# Patient Record
Sex: Female | Born: 1995 | Race: White | Hispanic: No | Marital: Single | Smoking: Never smoker
Health system: Southern US, Community
[De-identification: ages and names within clinical notes are randomized; demographics above are authoritative.]

---

## 2015-07-11 ENCOUNTER — Emergency Department: Payer: BLUE CROSS/BLUE SHIELD

## 2015-07-11 ENCOUNTER — Encounter: Payer: Self-pay | Admitting: *Deleted

## 2015-07-11 ENCOUNTER — Emergency Department
Admission: EM | Admit: 2015-07-11 | Discharge: 2015-07-12 | Disposition: A | Discharge status: Home / Self Care | Payer: BLUE CROSS/BLUE SHIELD | Attending: Emergency Medicine | Admitting: Emergency Medicine

## 2015-07-11 DIAGNOSIS — R519 Headache, unspecified: Secondary | ICD-10-CM

## 2015-07-11 DIAGNOSIS — B349 Viral infection, unspecified: Secondary | ICD-10-CM | POA: Insufficient documentation

## 2015-07-11 DIAGNOSIS — Z3202 Encounter for pregnancy test, result negative: Secondary | ICD-10-CM | POA: Insufficient documentation

## 2015-07-11 DIAGNOSIS — R509 Fever, unspecified: Secondary | ICD-10-CM

## 2015-07-11 DIAGNOSIS — R000 Tachycardia, unspecified: Secondary | ICD-10-CM | POA: Insufficient documentation

## 2015-07-11 DIAGNOSIS — R51 Headache: Secondary | ICD-10-CM

## 2015-07-11 LAB — URINALYSIS COMPLETE WITH MICROSCOPIC (ARMC ONLY)
BILIRUBIN URINE: NEGATIVE
GLUCOSE, UA: NEGATIVE mg/dL
Hgb urine dipstick: NEGATIVE
KETONES UR: NEGATIVE mg/dL
NITRITE: NEGATIVE
Protein, ur: NEGATIVE mg/dL
SPECIFIC GRAVITY, URINE: 1.018 (ref 1.005–1.030)
pH: 6 (ref 5.0–8.0)

## 2015-07-11 LAB — CBC WITH DIFFERENTIAL/PLATELET
BASOS ABS: 0 10*3/uL (ref 0–0.1)
Basophils Relative: 0 %
EOS ABS: 0 10*3/uL (ref 0–0.7)
Eosinophils Relative: 0 %
HEMATOCRIT: 40.1 % (ref 35.0–47.0)
HEMOGLOBIN: 13.3 g/dL (ref 12.0–16.0)
Lymphocytes Relative: 13 %
Lymphs Abs: 1 10*3/uL (ref 1.0–3.6)
MCH: 30 pg (ref 26.0–34.0)
MCHC: 33.1 g/dL (ref 32.0–36.0)
MCV: 90.7 fL (ref 80.0–100.0)
MONOS PCT: 10 %
Monocytes Absolute: 0.8 10*3/uL (ref 0.2–0.9)
NEUTROS ABS: 6.1 10*3/uL (ref 1.4–6.5)
NEUTROS PCT: 77 %
Platelets: 181 10*3/uL (ref 150–440)
RBC: 4.42 MIL/uL (ref 3.80–5.20)
RDW: 14.7 % — ABNORMAL HIGH (ref 11.5–14.5)
WBC: 8 10*3/uL (ref 3.6–11.0)

## 2015-07-11 LAB — PROTIME-INR
INR: 1.1
Prothrombin Time: 14.4 seconds (ref 11.4–15.0)

## 2015-07-11 LAB — COMPREHENSIVE METABOLIC PANEL
ALBUMIN: 3.8 g/dL (ref 3.5–5.0)
ALK PHOS: 41 U/L (ref 38–126)
ALT: 14 U/L (ref 14–54)
ANION GAP: 7 (ref 5–15)
AST: 25 U/L (ref 15–41)
BUN: 6 mg/dL (ref 6–20)
CALCIUM: 8.6 mg/dL — AB (ref 8.9–10.3)
CO2: 25 mmol/L (ref 22–32)
Chloride: 104 mmol/L (ref 101–111)
Creatinine, Ser: 0.77 mg/dL (ref 0.44–1.00)
GFR calc Af Amer: >60 mL/min (ref >60)
GFR calc non Af Amer: >60 mL/min (ref >60)
Glucose, Bld: 138 mg/dL — ABNORMAL HIGH (ref 65–99)
Potassium: 3.8 mmol/L (ref 3.5–5.1)
SODIUM: 136 mmol/L (ref 135–145)
TOTAL PROTEIN: 7.6 g/dL (ref 6.5–8.1)
Total Bilirubin: 0.6 mg/dL (ref 0.3–1.2)

## 2015-07-11 LAB — PROTEIN, CSF: TOTAL PROTEIN, CSF: 15 mg/dL (ref 15–45)

## 2015-07-11 LAB — POCT PREGNANCY, URINE: PREG TEST UR: NEGATIVE

## 2015-07-11 LAB — GLUCOSE, CAPILLARY: GLUCOSE-CAPILLARY: 80 mg/dL (ref 65–99)

## 2015-07-11 LAB — MONONUCLEOSIS SCREEN: Mono Screen: NEGATIVE

## 2015-07-11 LAB — LACTIC ACID, PLASMA: LACTIC ACID, VENOUS: 0.9 mmol/L (ref 0.5–2.0)

## 2015-07-11 LAB — GLUCOSE, CSF: GLUCOSE CSF: 74 mg/dL — AB (ref 40–70)

## 2015-07-11 MED ORDER — SODIUM CHLORIDE 0.9 % IV BOLUS (SEPSIS)
1000.0000 mL | Freq: Once | INTRAVENOUS | Status: AC
  Administered 2015-07-11: 1000 mL via INTRAVENOUS

## 2015-07-11 MED ORDER — LIDOCAINE HCL 2 % IJ SOLN
20.0000 mL | Freq: Once | INTRAMUSCULAR | Status: DC
  Filled 2015-07-11: qty 20

## 2015-07-11 MED ORDER — LIDOCAINE HCL (PF) 1 % IJ SOLN
5.0000 mL | Freq: Once | INTRAMUSCULAR | Status: AC
  Administered 2015-07-11: 5 mL via INTRADERMAL

## 2015-07-11 MED ORDER — MORPHINE SULFATE (PF) 2 MG/ML IV SOLN
2.0000 mg | Freq: Once | INTRAVENOUS | Status: AC
  Administered 2015-07-11: 2 mg via INTRAVENOUS
  Filled 2015-07-11: qty 1

## 2015-07-11 MED ORDER — KETOROLAC TROMETHAMINE 30 MG/ML IJ SOLN
30.0000 mg | Freq: Once | INTRAMUSCULAR | Status: AC
  Administered 2015-07-11: 30 mg via INTRAVENOUS
  Filled 2015-07-11: qty 1

## 2015-07-11 MED ORDER — ACETAMINOPHEN 325 MG PO TABS: 650.0000 mg | ORAL_TABLET | Freq: Once | ORAL | Status: DC | PRN

## 2015-07-11 MED ORDER — IBUPROFEN 800 MG PO TABS: 800.0000 mg | ORAL_TABLET | Freq: Once | ORAL | Status: DC

## 2015-07-11 MED ORDER — LIDOCAINE HCL (PF) 1 % IJ SOLN
INTRAMUSCULAR | Status: AC
Start: 2015-07-11 — End: 2015-07-11
  Administered 2015-07-11: 23:00:00
  Filled 2015-07-11: qty 15

## 2015-07-11 MED ORDER — ONDANSETRON 4 MG PO TBDP: 4.0000 mg | ORAL_TABLET | Freq: Four times a day (QID) | ORAL | Status: DC | PRN

## 2015-07-11 NOTE — ED Notes (Signed)
Went to [facility] yesterday for same. Was negative for strep and flu. Sent back to school. Still with fever today, headache. Bodyaches. States head hurts worse when she lays down.

## 2015-07-11 NOTE — ED Notes (Signed)
Pt reports 2 day hx of fever >103, HA and generalized body aches.  Increased pain while laying down.  Pt is a [employer] student and [facility] [employer].  [facility] and [facility] negative for the flu and strep.  Denies N/V/D.

## 2015-07-11 NOTE — ED Provider Notes (Signed)
[facility] Emergency Department Provider Note REMINDER - THIS NOTE IS NOT A FINAL MEDICAL RECORD UNTIL IT IS SIGNED. UNTIL THEN, THE CONTENT BELOW MAY REFLECT INFORMATION FROM A DOCUMENTATION TEMPLATE, NOT THE ACTUAL PATIENT VISIT. ____________________________________________  Time seen: Approximately 9:45 PM  I have reviewed the triage vital signs and the nursing notes.   HISTORY  Chief Complaint Headache    HPI Kelsey Rollins is a 19 y.o. female no significant medical history. She presents today after 2 days of fever greater than 103, moderate to severe throbbing headachewith light sensitivity, but no neck pain or stiffness. She reports generalized body aches, slightly weak cough, nonproductive. She reports 2 roommates of an ill recently. She denies any urinary symptoms, she did have some vaginal discharge and a few weeks ago but was evaluated in [place] for this and was told that all samples are normal. She denies any pelvic pain today. She does use tampons but only last use a couple weeks ago and has not lost any.  She denies any tick bites, insect bites, rash. No abdominal pain nausea or vomiting. No pelvic pain. No numbness or weakness.  She was seen at urgent care yesterday and was told she had a negative strep and also had a flu test which was negative.  She presents today she is not feeling better and continues to have severe headache with associated fever.   History reviewed. No pertinent past medical history.  There are no active problems to display for this patient.   History reviewed. No pertinent past surgical history.  Current Outpatient Rx  Name  Route  Sig  Dispense  Refill  . ondansetron (ZOFRAN ODT) 4 MG disintegrating tablet   Oral   Take 1 tablet (4 mg total) by mouth every 6 (six) hours as needed for nausea or vomiting.   20 tablet   0     Allergies Review of patient's allergies indicates no known allergies.  History  reviewed. No pertinent family history.  Social History Social History  Substance Use Topics  . Smoking status: Never Smoker   . Smokeless tobacco: None  . Alcohol Use: No    Review of Systems Constitutional: Fevers chills and body aches  Eyes: No visual changes. ENT: No sore throat. Cardiovascular: Denies chest pain. Respiratory: Denies shortness of breath. Does have occasional dry cough. Gastrointestinal: No abdominal pain.  Some nausea but, no vomiting.  No diarrhea.  No constipation. Genitourinary: Negative for dysuria. Musculoskeletal: Negative for back pain. Skin: Negative for rash. Neurological: Negative for focal weakness or numbness.  No travel. She has been previously immunized, but did not receive meningitis vaccination.  10-point ROS otherwise negative.  ____________________________________________   PHYSICAL EXAM:  VITAL SIGNS: ED Triage Vitals  Enc Vitals Group     BP 07/11/15 1955 100/68 mmHg     Pulse Rate 07/11/15 1955 126     Resp 07/11/15 1955 20     Temp 07/11/15 1955 103.2 F (39.6 C)     Temp Source 07/11/15 1955 Oral     SpO2 07/11/15 1955 99 %     Weight 07/11/15 1955 130 lb (58.968 kg)     Height 07/11/15 1955  (1.651 m)     Head Cir --      Peak Flow --      Pain Score 07/11/15 2000 10     Pain Loc --      Pain Edu? --      Excl. in GC? --  Constitutional: Alert and oriented. Generally fatigued and somewhat ill-appearing appearing and in no acute distress. Amicable, escorted by her mother. Both are very kind.  Eyes: Conjunctivae are normal. PERRL. EOMI. Head: Atraumatic. Nose: No congestion/rhinnorhea. No tonsillar hypertrophy or exudates. Mouth/Throat: Mucous membranes are moist.  Oropharynx non-erythematous. Neck: No stridor.  No obvious meningismus. Turning head side to side does make her headache somewhat worse however. Cardiovascular: Tachycardic rate, regular rhythm. Grossly normal heart sounds.  Good peripheral  circulation. Respiratory: Normal respiratory effort.  No retractions. Lungs CTAB. Occasional dry cough. Gastrointestinal: Soft and nontender. No distention. No abdominal bruits. No CVA tenderness. Musculoskeletal: No lower extremity tenderness nor edema.  No joint effusions. Neurologic:  Normal speech and language. No gross focal neurologic deficits are appreciated. No gait instability. She does not have appreciable photophobia. Skin:  Skin is warm, dry and intact. No rash noted. Psychiatric: Mood and affect are normal. Speech and behavior are normal.  Patient reports she is not sexually active. ____________________________________________   LABS (all labs ordered are listed, but only abnormal results are displayed)  Labs Reviewed  CBC WITH DIFFERENTIAL/PLATELET - Abnormal; Notable for the following:    RDW 14.7 (*)    All other components within normal limits  COMPREHENSIVE METABOLIC PANEL - Abnormal; Notable for the following:    Glucose, Bld 138 (*)    Calcium 8.6 (*)    All other components within normal limits  URINALYSIS COMPLETEWITH MICROSCOPIC ([facility] ONLY) - Abnormal; Notable for the following:    Color, Urine YELLOW (*)    APPearance HAZY (*)    Leukocytes, UA TRACE (*)    Bacteria, UA RARE (*)    Squamous Epithelial / LPF 0-5 (*)    All other components within normal limits  CULTURE, GROUP A STREP ([facility] ONLY)  CSF CULTURE  LACTIC ACID, PLASMA  PROTIME-INR  MONONUCLEOSIS SCREEN  LACTIC ACID, PLASMA  CSF CELL COUNT WITH DIFFERENTIAL  GLUCOSE, CSF  PROTEIN, CSF  CSF CELL COUNT WITH DIFFERENTIAL  HERPES SIMPLEX VIRUS(HSV) DNA BY PCR  ROCKY MTN SPOTTED FVR ABS PNL(IGG+IGM)  POC URINE PREG, ED  POCT PREGNANCY, URINE   ____________________________________________  EKG   ____________________________________________  RADIOLOGY  DG Chest 2 View (Final result) Result time: 07/11/15 21:52:00   Final result by Rad Results In Interface (07/11/15 21:52:00)    Narrative:   CLINICAL DATA: Fever and generalized body aches for 2 days.  EXAM: CHEST 2 VIEW  COMPARISON: None.  FINDINGS: The heart size and mediastinal contours are within normal limits. Both lungs are clear. The visualized skeletal structures are unremarkable.  IMPRESSION: No active cardiopulmonary disease.    ____________________________________________   PROCEDURES  Procedure(s) performed: Number puncture  Critical Care performed: No  LUMBAR PUNCTURE  Date/Time: 07/11/2015 at 11:13 PM Performed by: Sharyn Creamer  Consent: Verbal consent obtained. Written consent obtained. Risks and benefits: risks, benefits and alternatives were discussed Consent given by: Patient Patient understanding: patient states understanding of the procedure being performed  Patient consent: the patient's understanding of the procedure matches consent given  Procedure consent: procedure consent matches procedure scheduled  Relevant documents: relevant documents present and verified  Test results: test results available and properly labeled Site marked: the operative site was marked Imaging studies: imaging studies available  Required items: required blood products, implants, devices, and special equipment available  Patient identity confirmed: verbally with patient and arm band  Time out: Immediately prior to procedure a "time out" was called to verify the correct patient, procedure, equipment,  support staff and site/side marked as required.  Indications: Severe headache and fever, rule out meningitis  Anesthesia: local infiltration Local anesthetic: lidocaine 1% without epinephrine Anesthetic total: 4 ml Patient sedated: No  Analgesia:  Preparation: Patient was prepped and draped in the usual sterile fashion. Lumbar space: L3-L4 interspace Patient's position: Seated upright  Needle gauge: 22 Needle length: 3.5 in Number of attempts: 1 Opening pressure: Not measured, performed in  seated position  Fluid appearance: Clear Tubes of fluid: 4 Total volume: 8 ml Post-procedure: site cleaned and adhesive bandage applied Patient tolerance: Patient tolerated the procedure well with no immediate complications, neurologically intact distally. Tolerated well without significant discomfort or pain.  ____________________________________________   INITIAL IMPRESSION / ASSESSMENT AND PLAN / ED COURSE  Pertinent labs & imaging results that were available during my care of the patient were reviewed by me and considered in my medical decision making (see chart for details).  Patient resents for evaluation of severe fever, headache. Also dry nonproductive cough. Seen in urgent care and felt to have viral syndrome yesterday, with a negative flu and strep test per mother. Today she presents to ER for ongoing severe headache and fever. On exam she has no focal bacterial infection, and she does not appear to have significant risk factor for any immunocompromise status. I am genuinely concerned about the severity of headache that she reports, however her overall symptoms of allergy does seem to be most likely viral in etiology, but given the extent of her fever and the severity of her headache I do believe that rule out meningitis is indicated if no other causes detected.  Wide battery of tests were performed, chest x-ray, urinalysis, lab work, mono all negative for obvious etiology for the patient's headache and fever. Based on this, spinal puncture was recommended and performed.  ----------------------------------------- 9:52 PM on 07/11/2015 -----------------------------------------  Discussed the risks, benefits, alternatives and potential, locations to lumbar puncture with the patient and her mother. Based on no clear other etiology for the patient's headache and high fevers, we will proceed with lumbar puncture as we rule out meningitis. I do believe this is warranted given the patient's  report of a moderate to severe headache, high-grade fevers. No other focal infection to clearly point, though viral illness certainly a strong possibility, however given her symptomatology exclusion of meningitis appears to be a vital at this time.  Consent discussed and signed by the patient.  ----------------------------------------- 11:16 PM on 07/11/2015 -----------------------------------------  Ongoing care and disposition assigned Dr. Dolores FrameSung. Plan of care is to follow-up on lumbar puncture results, if indication of infection would start antibiotics and admit for possible meningitis, however my general suspicion is that this is likely self-limited viral type illness. If her CSF studies appear normal, I would advise discharge with Zofran and careful return precautions for viral syndrome. I did discuss this plan of care with the patient as well, and I did advise and careful return precautions should she be discharged once her CSF studies are performed. I did not immediately initiate antibiotics after lumbar puncture is my general consensus is likely viral, but I certainly do wish to exclude acute bacterial meningitis though it is not my primary diagnosis at this point. ____________________________________________   FINAL CLINICAL IMPRESSION(S) / ED DIAGNOSES  Final diagnoses:  Fever and chills  Acute nonintractable headache, unspecified headache type      Sharyn CreamerMark Cardale Dorer, MD 07/11/15 2318

## 2015-07-11 NOTE — ED Notes (Signed)
MD at bedside for LP.

## 2015-07-12 LAB — CSF CELL COUNT WITH DIFFERENTIAL
EOS CSF: 0 % (ref 0–1)
EOS CSF: 0 % (ref 0–1)
LYMPHS CSF: 0 % — AB (ref 40–80)
LYMPHS CSF: 67 % (ref 40–80)
Monocyte-Macrophage-Spinal Fluid: 0 % — ABNORMAL LOW (ref 15–45)
Monocyte-Macrophage-Spinal Fluid: 33 % (ref 15–45)
RBC Count, CSF: 0 /mm3
RBC Count, CSF: 3 /mm3 — ABNORMAL HIGH
SEGMENTED NEUTROPHILS-CSF: 0 % (ref 0–6)
SEGMENTED NEUTROPHILS-CSF: 0 % (ref 0–6)
TUBE #: 1
Tube #: 4
WBC CSF: 0 /mm3 (ref 0–5)
WBC CSF: 3 /mm3 (ref 0–5)

## 2015-07-12 MED ORDER — ACETAMINOPHEN 500 MG PO TABS
1000.0000 mg | ORAL_TABLET | Freq: Once | ORAL | Status: AC
  Administered 2015-07-12: 1000 mg via ORAL
  Filled 2015-07-12: qty 2

## 2015-07-12 MED ORDER — PROCHLORPERAZINE MALEATE 10 MG PO TABS: 10.0000 mg | ORAL_TABLET | Freq: Four times a day (QID) | ORAL | Status: AC | PRN

## 2015-07-12 NOTE — ED Provider Notes (Signed)
-----------------------------------------   1:28 AM on 07/12/2015 -----------------------------------------  Updated patient and her mother of negative CSF results. Patient is feeling significantly better. She has no neurological deficits at this time. Mother requests a prescription for headache. I will write for Compazine. Encourage patient to push fluids this weekend and limit her activities. Strict return precautions given. Both verbalize understanding and agree with plan of care.  Irean HongJade J Najeh Credit, MD 07/12/15 [phone]

## 2015-07-12 NOTE — Discharge Instructions (Signed)
1. Alternate Tylenol and Motrin every 3-4 hours as needed for temperature greater than 100.67F. 2. You may take Compazine as needed for headache and nausea (#20). 3. Drink plenty of fluids this weekend and rest as much as possible. 4. Return to the ER for worsening symptoms, persistent vomiting, difficulty breathing or other concerns.  Fever, Adult A fever is an increase in the body's temperature. It is usually defined as a temperature of 100F (38C) or higher. Brief mild or moderate fevers generally have no long-term effects, and they often do not require treatment. Moderate or high fevers may make you feel uncomfortable and can sometimes be a sign of a serious illness or disease. The sweating that may occur with repeated or prolonged fever may also cause dehydration. Fever is confirmed by taking a temperature with a thermometer. A measured temperature can vary with:  Age.  Time of day.  Location of the thermometer:  Mouth (oral).  Rectum (rectal).  Ear (tympanic).  Underarm (axillary).  Forehead (temporal). HOME CARE INSTRUCTIONS Pay attention to any changes in your symptoms. Take these actions to help with your condition:  Take over-the counter and prescription medicines only as told by your health care provider. Follow the dosing instructions carefully.  If you were prescribed an antibiotic medicine, take it as told by your health care provider. Do not stop taking the antibiotic even if you start to feel better.  Rest as needed.  Drink enough fluid to keep your urine clear or pale yellow. This helps to prevent dehydration.  Sponge yourself or bathe with room-temperature water to help reduce your body temperature as needed. Do not use ice water.  Do not overbundle yourself in blankets or heavy clothes. SEEK MEDICAL CARE IF:  You vomit.  You cannot eat or drink without vomiting.  You have diarrhea.  You have pain when you urinate.  Your symptoms do not improve with  treatment.  You develop new symptoms.  You develop excessive weakness. SEEK IMMEDIATE MEDICAL CARE IF:  You have shortness of breath or have trouble breathing.  You are dizzy or you faint.  You are disoriented or confused.  You develop signs of dehydration, such as a dry mouth, decreased urination, or paleness.  You develop severe pain in your abdomen.  You have persistent vomiting or diarrhea.  You develop a skin rash.  Your symptoms suddenly get worse.   This information is not intended to replace advice given to you by your health care provider. Make sure you discuss any questions you have with your health care provider.   Document Released: 02/16/2001 Document Revised: 05/14/2015 Document Reviewed: 10/17/2014 Elsevier Interactive Patient Education 2016 [employer] Inc[employer]  General Headache Without Cause A headache is pain or discomfort felt around the head or neck area. The specific cause of a headache may not be found. There are many causes and types of headaches. A few common ones are:  Tension headaches.  Migraine headaches.  Cluster headaches.  Chronic daily headaches. HOME CARE INSTRUCTIONS  Watch your condition for any changes. Take these steps to help with your condition: Managing Pain  Take over-the-counter and prescription medicines only as told by your health care provider.  Lie down in a dark, quiet room when you have a headache.  If directed, apply ice to the head and neck area:  Put ice in a plastic bag.  Place a towel between your skin and the bag.  Leave the ice on for 20 minutes, 2-3 times per day.  Use a heating pad or hot shower to apply heat to the head and neck area as told by your health care provider.  Keep lights dim if bright lights bother you or make your headaches worse. Eating and Drinking  Eat meals on a regular schedule.  Limit alcohol use.  Decrease the amount of caffeine you drink, or stop drinking caffeine. General  Instructions  Keep all follow-up visits as told by your health care provider. This is important.  Keep a headache journal to help find out what may trigger your headaches. For example, write down:  What you eat and drink.  How much sleep you get.  Any change to your diet or medicines.  Try massage or other relaxation techniques.  Limit stress.  Sit up straight, and do not tense your muscles.  Do not use tobacco products, including cigarettes, chewing tobacco, or e-cigarettes. If you need help quitting, ask your health care provider.  Exercise regularly as told by your health care provider.  Sleep on a regular schedule. Get 7-9 hours of sleep, or the amount recommended by your health care provider. SEEK MEDICAL CARE IF:   Your symptoms are not helped by medicine.  You have a headache that is different from the usual headache.  You have nausea or you vomit.  You have a fever. SEEK IMMEDIATE MEDICAL CARE IF:   Your headache becomes severe.  You have repeated vomiting.  You have a stiff neck.  You have a loss of vision.  You have problems with speech.  You have pain in the eye or ear.  You have muscular weakness or loss of muscle control.  You lose your balance or have trouble walking.  You feel faint or pass out.  You have confusion.   This information is not intended to replace advice given to you by your health care provider. Make sure you discuss any questions you have with your health care provider.   Document Released: 08/23/2005 Document Revised: 05/14/2015 Document Reviewed: 12/16/2014 Elsevier Interactive Patient Education 2016 [employer] Inc.   Viral Infections A viral infection can be caused by different types of viruses.Most viral infections are not serious and resolve on their own. However, some infections may cause severe symptoms and may lead to further complications. SYMPTOMS Viruses can frequently cause:  Minor sore throat.  Aches and  pains.  Headaches.  Runny nose.  Different types of rashes.  Watery eyes.  Tiredness.  Cough.  Loss of appetite.  Gastrointestinal infections, resulting in nausea, vomiting, and diarrhea. These symptoms do not respond to antibiotics because the infection is not caused by bacteria. However, you might catch a bacterial infection following the viral infection. This is sometimes called a "superinfection." Symptoms of such a bacterial infection may include:  Worsening sore throat with pus and difficulty swallowing.  Swollen neck glands.  Chills and a high or persistent fever.  Severe headache.  Tenderness over the sinuses.  Persistent overall ill feeling (malaise), muscle aches, and tiredness (fatigue).  Persistent cough.  Yellow, green, or brown mucus production with coughing. HOME CARE INSTRUCTIONS   Only take over-the-counter or prescription medicines for pain, discomfort, diarrhea, or fever as directed by your caregiver.  Drink enough water and fluids to keep your urine clear or pale yellow. Sports drinks can provide valuable electrolytes, sugars, and hydration.  Get plenty of rest and maintain proper nutrition. Soups and broths with crackers or rice are fine. SEEK IMMEDIATE MEDICAL CARE IF:   You have severe headaches, shortness  of breath, chest pain, neck pain, or an unusual rash.  You have uncontrolled vomiting, diarrhea, or you are unable to keep down fluids.  You or your child has an oral temperature above 102 F (38.9 C), not controlled by medicine.  Your baby is older than 3 months with a rectal temperature of 102 F (38.9 C) or higher.  Your baby is 323 months old or younger with a rectal temperature of 100.4 F (38 C) or higher. MAKE SURE YOU:   Understand these instructions.  Will watch your condition.  Will get help right away if you are not doing well or get worse.   This information is not intended to replace advice given to you by your health  care provider. Make sure you discuss any questions you have with your health care provider.   Document Released: 06/02/2005 Document Revised: 11/15/2011 Document Reviewed: 01/29/2015 Elsevier Interactive Patient Education [employer] Inc[employer]

## 2015-07-13 ENCOUNTER — Emergency Department
Admission: EM | Admit: 2015-07-13 | Discharge: 2015-07-13 | Disposition: A | Discharge status: Home / Self Care | Payer: BLUE CROSS/BLUE SHIELD | Attending: Emergency Medicine | Admitting: Emergency Medicine

## 2015-07-13 ENCOUNTER — Encounter: Payer: Self-pay | Admitting: Emergency Medicine

## 2015-07-13 ENCOUNTER — Other Ambulatory Visit: Payer: Self-pay

## 2015-07-13 DIAGNOSIS — R55 Syncope and collapse: Secondary | ICD-10-CM | POA: Insufficient documentation

## 2015-07-13 DIAGNOSIS — Z3202 Encounter for pregnancy test, result negative: Secondary | ICD-10-CM | POA: Insufficient documentation

## 2015-07-13 DIAGNOSIS — E860 Dehydration: Secondary | ICD-10-CM

## 2015-07-13 DIAGNOSIS — R110 Nausea: Secondary | ICD-10-CM | POA: Insufficient documentation

## 2015-07-13 DIAGNOSIS — R509 Fever, unspecified: Secondary | ICD-10-CM | POA: Diagnosis present

## 2015-07-13 DIAGNOSIS — B349 Viral infection, unspecified: Secondary | ICD-10-CM | POA: Diagnosis not present

## 2015-07-13 LAB — URINALYSIS COMPLETE WITH MICROSCOPIC (ARMC ONLY)
BILIRUBIN URINE: NEGATIVE
Glucose, UA: NEGATIVE mg/dL
Hgb urine dipstick: NEGATIVE
NITRITE: NEGATIVE
PH: 5 (ref 5.0–8.0)
Protein, ur: 100 mg/dL — AB
SPECIFIC GRAVITY, URINE: 1.027 (ref 1.005–1.030)

## 2015-07-13 LAB — BASIC METABOLIC PANEL
ANION GAP: 8 (ref 5–15)
BUN: 7 mg/dL (ref 6–20)
CALCIUM: 8.6 mg/dL — AB (ref 8.9–10.3)
CO2: 25 mmol/L (ref 22–32)
CREATININE: 0.7 mg/dL (ref 0.44–1.00)
Chloride: 101 mmol/L (ref 101–111)
Glucose, Bld: 118 mg/dL — ABNORMAL HIGH (ref 65–99)
Potassium: 4 mmol/L (ref 3.5–5.1)
SODIUM: 134 mmol/L — AB (ref 135–145)

## 2015-07-13 LAB — CULTURE, GROUP A STREP (THRC)

## 2015-07-13 LAB — CBC
HCT: 41.3 % (ref 35.0–47.0)
Hemoglobin: 13.8 g/dL (ref 12.0–16.0)
MCH: 30.1 pg (ref 26.0–34.0)
MCHC: 33.5 g/dL (ref 32.0–36.0)
MCV: 89.9 fL (ref 80.0–100.0)
PLATELETS: 173 10*3/uL (ref 150–440)
RBC: 4.59 MIL/uL (ref 3.80–5.20)
RDW: 14.5 % (ref 11.5–14.5)
WBC: 7.8 10*3/uL (ref 3.6–11.0)

## 2015-07-13 LAB — POCT PREGNANCY, URINE: Preg Test, Ur: NEGATIVE

## 2015-07-13 LAB — INFLUENZA PANEL BY PCR (TYPE A & B)
H1N1 flu by pcr: NOT DETECTED
INFLBPCR: NEGATIVE
Influenza A By PCR: NEGATIVE

## 2015-07-13 MED ORDER — SODIUM CHLORIDE 0.9 % IV BOLUS (SEPSIS)
1000.0000 mL | Freq: Once | INTRAVENOUS | Status: AC
  Administered 2015-07-13: 1000 mL via INTRAVENOUS

## 2015-07-13 MED ORDER — ONDANSETRON 4 MG PO TBDP: 4.0000 mg | ORAL_TABLET | Freq: Three times a day (TID) | ORAL | Status: AC | PRN

## 2015-07-13 MED ORDER — IBUPROFEN 600 MG PO TABS
600.0000 mg | ORAL_TABLET | Freq: Once | ORAL | Status: AC
  Administered 2015-07-13: 600 mg via ORAL

## 2015-07-13 MED ORDER — ONDANSETRON HCL 4 MG/2ML IJ SOLN
4.0000 mg | Freq: Once | INTRAMUSCULAR | Status: AC
  Administered 2015-07-13: 4 mg via INTRAVENOUS
  Filled 2015-07-13: qty 2

## 2015-07-13 MED ORDER — IBUPROFEN 600 MG PO TABS
ORAL_TABLET | ORAL | Status: AC
  Administered 2015-07-13: 600 mg via ORAL
  Filled 2015-07-13: qty 1

## 2015-07-13 NOTE — ED Notes (Signed)
Seen in ED Friday night and diagnosed with viral syndrome.  Patient has been resting, using tylenol and motrin for fever.  While taking a shower this morning at around 1130, patient fainted in shower.  Last medicated for fever at 0900 with tylenol.

## 2015-07-13 NOTE — ED Provider Notes (Signed)
[facility] Emergency Department Provider Note  Time seen: 2:26 PM  I have reviewed the triage vital signs and the nursing notes.   HISTORY  Chief Complaint Fever and Loss of Consciousness    HPI Kelsey Rollins is a 19 y.o. female with no past medical history who presents the emergency department today after syncopal episode. According to the patient and her mother the patient has been ill for the past 2-3 days. She is having upper respiratory symptoms cough and congestion along with fevers. The patient was seen here on 07/11/15 with an extensive workup including CSF which showed no acute findings (cultures pending). Patient states this morning she was feeling a little bit better still feeling nauseated, she got in the shower and within several minutes at a syncopal episode. Patient states she felt the episode coming on, felt nauseated like she was going to pass out, and then had a syncopal episode. Patient denies hitting her head. Patient states she has been trying to drink more fluids but admits she has not been keeping up with her fluids over the weekend due to nausea. Denies diarrhea, dysuria. States her last fever was yesterday. Denies any headache today. States she has been feeling dizzy on and off all day, but denies any dizziness currently.   History reviewed. No pertinent past medical history.  There are no active problems to display for this patient.   History reviewed. No pertinent past surgical history.  Current Outpatient Rx  Name  Route  Sig  Dispense  Refill  . prochlorperazine (COMPAZINE) 10 MG tablet   Oral   Take 1 tablet (10 mg total) by mouth every 6 (six) hours as needed for nausea.   20 tablet   0     Allergies Review of patient's allergies indicates no known allergies.  No family history on file.  Social History Social History  Substance Use Topics  . Smoking status: Never Smoker   . Smokeless tobacco: None  . Alcohol Use: No     Review of Systems Constitutional: Negative for fever. Positive for intermittent dizziness. Cardiovascular: Negative for chest pain. Respiratory: Negative for shortness of breath. Gastrointestinal: Negative for abdominal pain. Positive for nausea, negative for vomiting or diarrhea. Genitourinary: Negative for dysuria. Neurological: Negative for headache 10-point ROS otherwise negative.  ____________________________________________   PHYSICAL EXAM:  VITAL SIGNS: ED Triage Vitals  Enc Vitals Group     BP 07/13/15 1242 114/57 mmHg     Pulse Rate 07/13/15 1242 111     Resp 07/13/15 1242 16     Temp 07/13/15 1242 97.4 F (36.3 C)     Temp Source 07/13/15 1242 Oral     SpO2 07/13/15 1242 96 %     Weight 07/13/15 1242 125 lb (56.7 kg)     Height 07/13/15 1242  (1.676 m)     Head Cir --      Peak Flow --      Pain Score 07/13/15 1242 4     Pain Loc --      Pain Edu? --      Excl. in GC? --    Constitutional: Alert and oriented. Well appearing and in no distress. Eyes: Normal exam ENT   Head: Normocephalic and atraumatic.   Mouth/Throat: Mucous membranes are moist. Cardiovascular: Normal rate, regular rhythm. No murmur Respiratory: Normal respiratory effort without tachypnea nor retractions. Breath sounds are clear Gastrointestinal: Soft and nontender. No distention. No CVA tenderness. No rebound or guarding. Musculoskeletal:  Nontender with normal range of motion in all extremities. Nontender lower extremities. No edema. Neurologic:  Normal speech and language. No gross focal neurologic deficits  Skin:  Skin is warm, dry and intact.  Psychiatric: Mood and affect are normal. Speech and behavior are normal.   ____________________________________________    EKG  EKG reviewed and interpreted by myself shows sinus rhythm at 100 bpm, narrow QRS, normal axis, normal intervals, nonspecific ST changes present. No ST  elevations.  ____________________________________________     INITIAL IMPRESSION / ASSESSMENT AND PLAN / ED COURSE  Pertinent labs & imaging results that were available during my care of the patient were reviewed by me and considered in my medical decision making (see chart for details).  Patient presents to the emergency department after a syncopal episode in a shower. Patient states it was a hot shower. Symptoms are most suggestive of an orthostatic/vasovagal event. Denies any chest pain now or at any time. Denies any headache. Overall the patient has a very normal exam besides a moderate cough. Likely upper respiratory infection. Patient had an extensive workup 07/11/15 including CSF, overall showing normal results.  [facility] (likely rapid flu), we will send a PCR test.  Influenza test negative. Patient states she is feeling better. I discussed with the patient the need to IV hydrate, and obtain plenty of rest. The patient is agreeable and will follow up with [facility] in 1-2 days for recheck. ____________________________________________   FINAL CLINICAL IMPRESSION(S) / ED DIAGNOSES  Viral syndrome Syncope   Minna AntisKevin Carolena Fairbank, MD 07/13/15 1643

## 2015-07-13 NOTE — Discharge Instructions (Signed)
As we discussed please drink plenty fluids and obtain plenty of rest over the next several days. Please drink Gatorade or oral rehydration solution to hydrate during the day. Please take a nausea medication as needed, as prescribed. Please obtain plenty of rest. Please follow-up with your doctor/[facility] in 1-2 days for recheck. Return to the emergency department for any further syncopal episodes, or any other symptom personally concerning to yourself.   Dehydration, Adult Dehydration means your body does not have as much fluid or water as it needs. It happens when you take in less fluid than you lose. Your kidneys, brain, and heart will not work properly without the right amount of fluids.  Dehydration can range from mild to severe. It should be treated right away to help prevent it from becoming severe. HOME CARE  Drink enough fluid to keep your pee (urine) clear or pale yellow.  Drink water or fluid slowly by taking small sips. You can also try sucking on ice cubes.  Have food or drinks that contain electrolytes. Examples include bananas and sports drinks.  Take over-the-counter and prescription medicines only as told by your doctor.  Prepare oral rehydration solution (ORS) according to the instructions that came with it. Take sips of ORS every 5 minutes until your pee returns to normal.  If you are throwing up (vomiting) or have watery poop (diarrhea), keep trying to drink water, ORS, or both.  If you have watery poop, avoid:  Drinks with caffeine.  Fruit juice.  Milk.  Carbonated soft drinks.  Do not take salt tablets. This can lead to having too much sodium in your body (hypernatremia). GET HELP IF:  You cannot eat or drink without throwing up.  You have had mild watery poop for longer than 24 hours.  You have a fever. GET HELP RIGHT AWAY IF:   You have very strong thirst.  You have very bad watery poop.  You have not peed in 6-8 hours, or you have peed only a  small amount of very dark pee.  You have shriveled skin.  You are dizzy, confused, or both.   This information is not intended to replace advice given to you by your health care provider. Make sure you discuss any questions you have with your health care provider.   Document Released: 06/19/2009 Document Revised: 05/14/2015 Document Reviewed: 01/08/2015 Elsevier Interactive Patient Education 2016 [employer].  Syncope Syncope means a person passes out (faints). The person usually wakes up in less than 5 minutes. It is important to seek medical care for syncope. HOME CARE  Have someone stay with you until you feel normal.  Do not drive, use machines, or play sports until your doctor says it is okay.  Keep all doctor visits as told.  Lie down when you feel like you might pass out. Take deep breaths. Wait until you feel normal before standing up.  Drink enough fluids to keep your pee (urine) clear or pale yellow.  If you take blood pressure or heart medicine, get up slowly. Take several minutes to sit and then stand. GET HELP RIGHT AWAY IF:   You have a severe headache.  You have pain in the chest, belly (abdomen), or back.  You are bleeding from the mouth or butt (rectum).  You have black or tarry poop (stool).  You have an irregular or very fast heartbeat.  You have pain with breathing.  You keep passing out, or you have shaking (seizures) when you pass out.  You pass out when sitting or lying down.  You feel confused.  You have trouble walking.  You have severe weakness.  You have vision problems. If you fainted, call for help (911 in [place] Do not drive yourself to the hospital.   This information is not intended to replace advice given to you by your health care provider. Make sure you discuss any questions you have with your health care provider.   Document Released: 02/09/2008 Document Revised: 01/07/2015 Document Reviewed: 10/22/2011 Elsevier Interactive  Patient Education [employer]2016 Elsevier Inc.

## 2015-07-13 NOTE — ED Notes (Signed)
Per mom she was seen on Friday and had several tests done d/t fever. Was able to tolerated low grade fever yesterday with tylenol and po fluids.states she fainted while in shower this am. Denies any pain just feels faint.

## 2015-07-14 LAB — ROCKY MTN SPOTTED FVR ABS PNL(IGG+IGM)
RMSF IGG: NEGATIVE
RMSF IgM: 0.57 index (ref 0.00–0.89)

## 2015-07-14 LAB — HERPES SIMPLEX VIRUS(HSV) DNA BY PCR
HSV 1 DNA: NEGATIVE
HSV 2 DNA: POSITIVE — AB

## 2015-07-15 ENCOUNTER — Telehealth: Payer: Self-pay | Admitting: Emergency Medicine

## 2015-07-15 LAB — CSF CULTURE W GRAM STAIN: Culture: NO GROWTH

## 2015-07-15 LAB — CSF CULTURE: SPECIAL REQUESTS: NORMAL

## 2015-07-15 NOTE — ED Notes (Signed)
i called pateint to check on condition and follow up plans as well as give results update of tests.  The number is mothers and she said patient is doing fine now.  Says she just dropped her off at school and that patient will be getting [facility].  i called [facility] and pt does not have an appt yet.  They had a number for patient [phone] and i called and left a message asking her to call me.

## 2015-07-18 LAB — POCT RAPID STREP A: Streptococcus, Group A Screen (Direct): NEGATIVE

## 2016-09-08 IMAGING — CR DG CHEST 2V
2 series · 2 of 2 positions shown · non-contrast
Comparison: None.

CLINICAL DATA: Fever and generalized body aches for 2 days.

EXAM:
CHEST  2 VIEW

[chest pa]
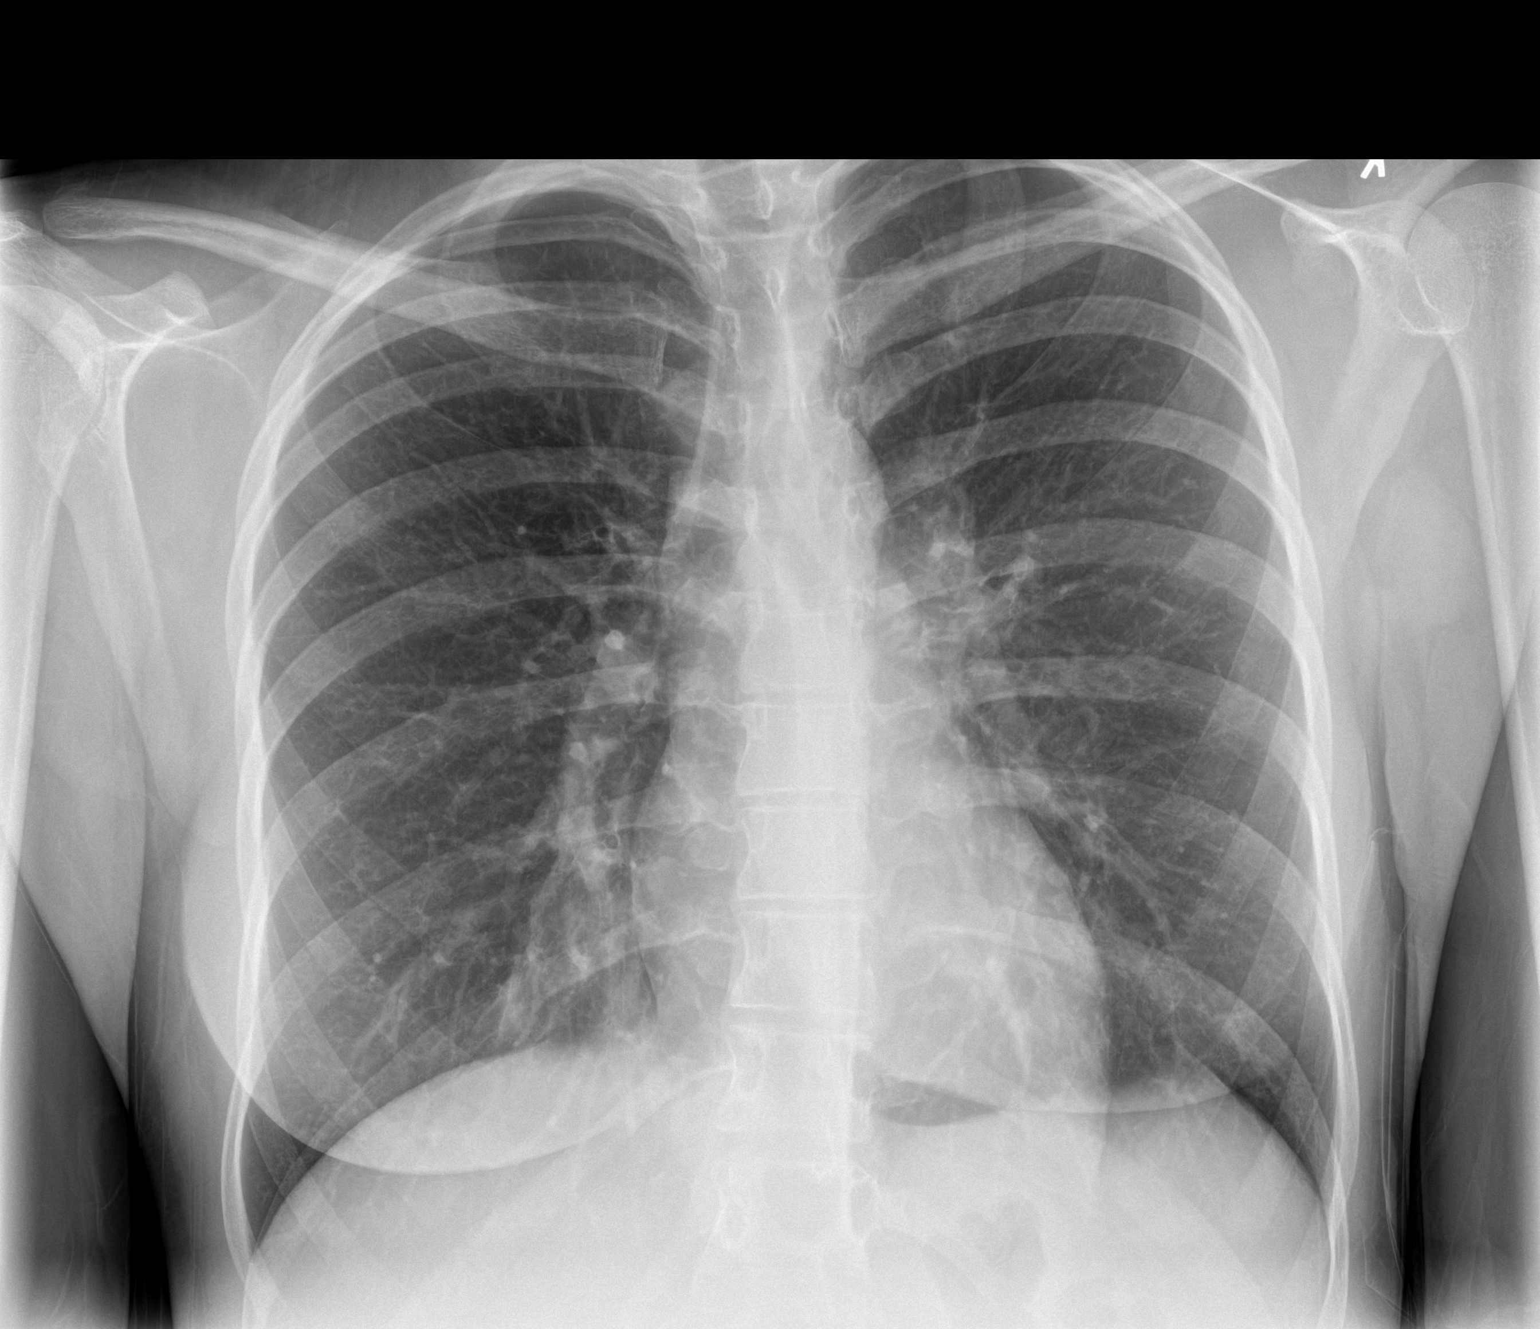

[chest lat]
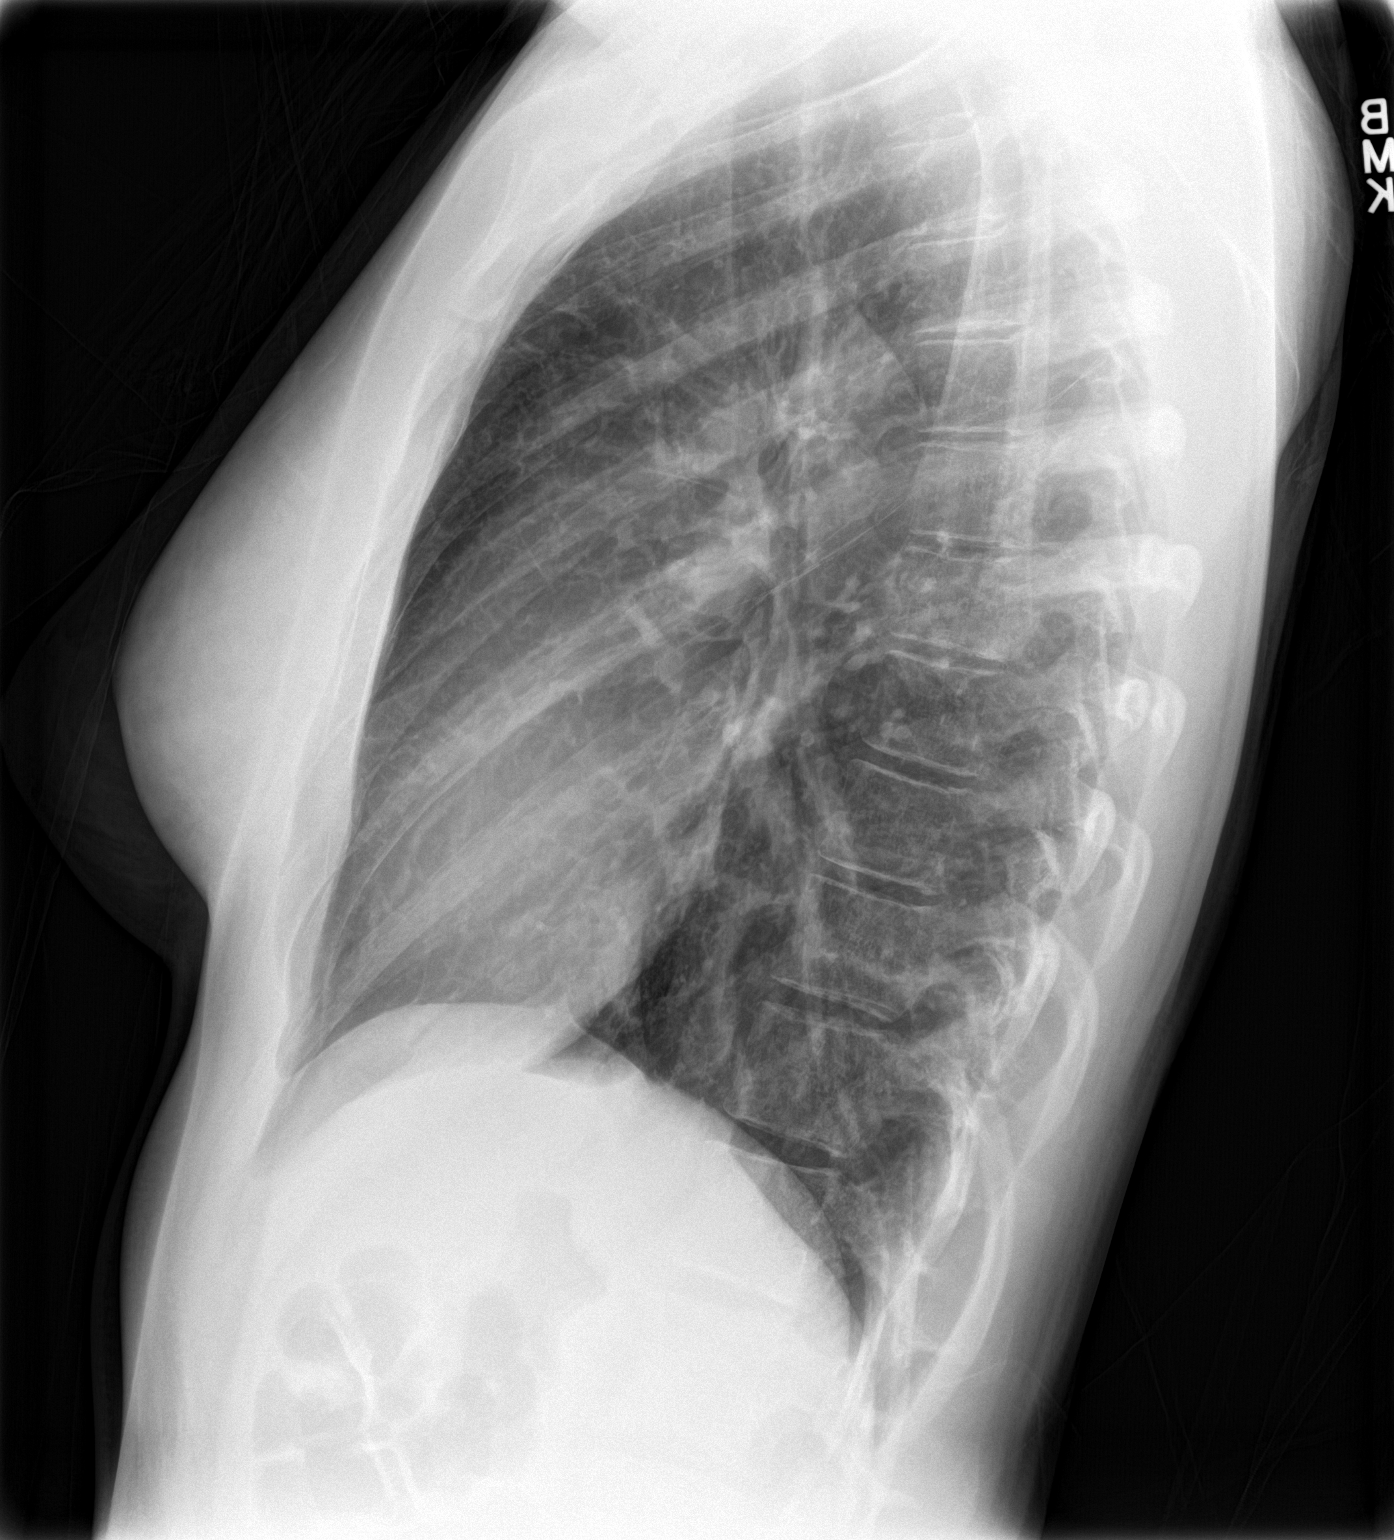

[2 of 2 positions shown; findings below may reference images not displayed]

FINDINGS: The heart size and mediastinal contours are within normal limits.
Both lungs are clear. The visualized skeletal structures are
unremarkable.
IMPRESSION: No active cardiopulmonary disease.

## 2016-10-30 ENCOUNTER — Encounter: Payer: Self-pay | Admitting: Emergency Medicine

## 2016-10-30 ENCOUNTER — Emergency Department
Admission: EM | Admit: 2016-10-30 | Discharge: 2016-10-30 | Disposition: A | Discharge status: Home / Self Care | Payer: BLUE CROSS/BLUE SHIELD | Attending: Emergency Medicine | Admitting: Emergency Medicine

## 2016-10-30 DIAGNOSIS — B349 Viral infection, unspecified: Secondary | ICD-10-CM | POA: Insufficient documentation

## 2016-10-30 DIAGNOSIS — J029 Acute pharyngitis, unspecified: Secondary | ICD-10-CM

## 2016-10-30 LAB — INFLUENZA PANEL BY PCR (TYPE A & B)
INFLAPCR: NEGATIVE
INFLBPCR: NEGATIVE

## 2016-10-30 LAB — POCT RAPID STREP A: Streptococcus, Group A Screen (Direct): NEGATIVE

## 2016-10-30 MED ORDER — NAPROXEN 500 MG PO TABS: 500.0000 mg | ORAL_TABLET | Freq: Two times a day (BID) | ORAL | 0 refills | Status: AC

## 2016-10-30 MED ORDER — FIRST-DUKES MOUTHWASH MT SUSP: 10.0000 mL | Freq: Four times a day (QID) | OROMUCOSAL | 0 refills | Status: AC

## 2016-10-30 MED ORDER — KETOROLAC TROMETHAMINE 60 MG/2ML IM SOLN
30.0000 mg | Freq: Once | INTRAMUSCULAR | Status: AC
  Administered 2016-10-30: 30 mg via INTRAMUSCULAR

## 2016-10-30 MED ORDER — LIDOCAINE VISCOUS 2 % MT SOLN: 5.0000 mL | Freq: Four times a day (QID) | OROMUCOSAL | 0 refills | Status: AC | PRN

## 2016-10-30 MED ORDER — KETOROLAC TROMETHAMINE 30 MG/ML IJ SOLN
30.0000 mg | Freq: Once | INTRAMUSCULAR | Status: DC
  Filled 2016-10-30: qty 1

## 2016-10-30 MED ORDER — ACETAMINOPHEN 325 MG PO TABS
650.0000 mg | ORAL_TABLET | Freq: Once | ORAL | Status: AC
  Administered 2016-10-30: 650 mg via ORAL
  Filled 2016-10-30: qty 2

## 2016-10-30 MED ORDER — ONDANSETRON 4 MG PO TBDP
4.0000 mg | ORAL_TABLET | Freq: Once | ORAL | Status: AC
Start: 2016-10-30 — End: 2016-10-30
  Administered 2016-10-30: 4 mg via ORAL
  Filled 2016-10-30: qty 1

## 2016-10-30 MED ORDER — CYCLOBENZAPRINE HCL 10 MG PO TABS: 10.0000 mg | ORAL_TABLET | Freq: Three times a day (TID) | ORAL | 0 refills | Status: AC | PRN

## 2016-10-30 NOTE — ED Triage Notes (Signed)
Pt to ed with c/o fever and sore throat for several days.

## 2016-10-30 NOTE — ED Notes (Signed)
Sore throat, fever that began yesterday. Pt also c/o left arm pain. Reports that she was doing pullups yesterday and it began to hurt after.

## 2016-10-30 NOTE — ED Provider Notes (Signed)
[facility] Emergency Department Provider Note   ____________________________________________   First MD Initiated Contact with Patient 10/30/16 1032     (approximate)  I have reviewed the triage vital signs and the nursing notes.   HISTORY  Chief Complaint Sore Throat    HPI Kelsey Rollins is a 21 y.o. female patient presented with fever and sore throat for several days. Patient states his difficulty with swallowing but she is able to tolerate food and fluids. Patient state she contacted the flu even though she took the injection earlier this season. Patient states she completed Tamiflu prescription bottle week ago. She stated there is some mild nausea but no vomiting. Patient states she's felt fever and chills. Patient also states she's feels dizzy. No palliative measures for this complaint. Patient also complaining of left arm pain secondary to doing pull-ups yesterday. Patient rates overall pulse 8/10. Patient describes the pain as "achy".   History reviewed. No pertinent past medical history.  There are no active problems to display for this patient.   History reviewed. No pertinent surgical history.  Prior to Admission medications   Medication Sig Start Date End Date Taking? Authorizing Provider  cyclobenzaprine (FLEXERIL) 10 MG tablet Take 1 tablet (10 mg total) by mouth 3 (three) times daily as needed. 10/30/16   Joni Reining, PA-C  Diphenhyd-Hydrocort-Nystatin (FIRST-DUKES MOUTHWASH) SUSP Use as directed 10 mLs in the mouth or throat 4 (four) times daily. Mixed with viscous lidocaine swish and swallow 10/30/16   Joni Reining, PA-C  lidocaine (XYLOCAINE) 2 % solution Use as directed 5 mLs in the mouth or throat every 6 (six) hours as needed for mouth pain. Mix with [facility] mouthwash swish and swallow. 10/30/16   Joni Reining, PA-C  naproxen (NAPROSYN) 500 MG tablet Take 1 tablet (500 mg total) by mouth 2 (two) times daily with a meal. 10/30/16   Joni Reining, PA-C  ondansetron (ZOFRAN ODT) 4 MG disintegrating tablet Take 1 tablet (4 mg total) by mouth every 8 (eight) hours as needed for nausea or vomiting. 07/13/15   Minna Antis, MD  prochlorperazine (COMPAZINE) 10 MG tablet Take 1 tablet (10 mg total) by mouth every 6 (six) hours as needed for nausea. 07/12/15   Irean Hong, MD    Allergies Patient has no known allergies.  History reviewed. No pertinent family history.  Social History Social History  Substance Use Topics  . Smoking status: Never Smoker  . Smokeless tobacco: Never Used  . Alcohol use No    Review of Systems Constitutional: Fever/chills and body aches.  Eyes: No visual changes. ENT: No throat.  Cardiovascular: Denies chest pain. Respiratory: Denies shortness of breath. Gastrointestinal: No abdominal pain. Nausea without vomiting.  No diarrhea.  No constipation. Genitourinary: Negative for dysuria. Musculoskeletal: Left arm pain.  Skin: Negative for rash. Neurological: Negative for headaches, focal weakness or numbness.   ____________________________________________   PHYSICAL EXAM:  VITAL SIGNS: ED Triage Vitals  Enc Vitals Group     BP 10/30/16 1001 113/68     Pulse Rate 10/30/16 1001 (!) 116     Resp 10/30/16 1001 20     Temp 10/30/16 1001 (!) 101 F (38.3 C)     Temp Source 10/30/16 1001 Oral     SpO2 10/30/16 1001 97 %     Weight 10/30/16 0958 125 lb (56.7 kg)     Height 10/30/16 0958 5\' 7"  (1.702 m)     Head Circumference --  Peak Flow --      Pain Score 10/30/16 0958 8     Pain Loc --      Pain Edu? --      Excl. in GC? --     Constitutional: Alert and oriented. Well appearing and in no acute distress. Febrile Eyes: Conjunctivae are normal. PERRL. EOMI. Head: Atraumatic. Nose: No congestion/rhinnorhea. Mouth/Throat: Mucous membranes are moist.  Oropharynx erythematous , exudate. Neck: No stridor.  No cervical spine tenderness to  palpation. Hematological/Lymphatic/Immunilogical: No cervical lymphadenopathy. Cardiovascular: Normal rate, regular rhythm. Grossly normal heart sounds.  Good peripheral circulation. Tachycardic Respiratory: Normal respiratory effort.  No retractions. Lungs CTAB. Gastrointestinal: Soft and nontender. No distention. No abdominal bruits. No CVA tenderness. Musculoskeletal: No lower extremity tenderness nor edema.  No joint effusions. Neurologic:  Normal speech and language. No gross focal neurologic deficits are appreciated. No gait instability. Skin:  Skin is warm, dry and intact. No rash noted. Psychiatric: Mood and affect are normal. Speech and behavior are normal.  ____________________________________________   LABS (all labs ordered are listed, but only abnormal results are displayed)  Labs Reviewed  INFLUENZA PANEL BY PCR (TYPE A & B)  POCT RAPID STREP A   ____________________________________________  EKG   ____________________________________________  RADIOLOGY   ____________________________________________   PROCEDURES  Procedure(s) performed: None  Procedures  Critical Care performed: No  ____________________________________________   INITIAL IMPRESSION / ASSESSMENT AND PLAN / ED COURSE  Pertinent labs & imaging results that were available during my care of the patient were reviewed by me and considered in my medical decision making (see chart for details).  Viral febrile illness. Discussed with patient and negative flu and rapid strep results. Patient given discharge care instructions. Patient given a prescription for [facility] mouthwash, viscous lidocaine, and ibuprofen.      ____________________________________________   FINAL CLINICAL IMPRESSION(S) / ED DIAGNOSES  Final diagnoses:  Viral pharyngitis  Viral illness      NEW MEDICATIONS STARTED DURING THIS VISIT:  New Prescriptions   CYCLOBENZAPRINE (FLEXERIL) 10 MG TABLET    Take 1 tablet (10  mg total) by mouth 3 (three) times daily as needed.   DIPHENHYD-HYDROCORT-NYSTATIN (FIRST-DUKES MOUTHWASH) SUSP    Use as directed 10 mLs in the mouth or throat 4 (four) times daily. Mixed with viscous lidocaine swish and swallow   LIDOCAINE (XYLOCAINE) 2 % SOLUTION    Use as directed 5 mLs in the mouth or throat every 6 (six) hours as needed for mouth pain. Mix with [facility] mouthwash swish and swallow.   NAPROXEN (NAPROSYN) 500 MG TABLET    Take 1 tablet (500 mg total) by mouth 2 (two) times daily with a meal.     Note:  This document was prepared using Dragon voice recognition software and may include unintentional dictation errors.    Joni ReiningRonald K Carollyn Etcheverry, PA-C 10/30/16 1203    Governor Rooksebecca Lord, MD 10/30/16 [phone]
